# Patient Record
Sex: Female | Born: 1995 | Race: Black or African American | Hispanic: No | Marital: Single | Smoking: Current some day smoker
Health system: Southern US, Community
[De-identification: ages and names within clinical notes are randomized; demographics above are authoritative.]

---
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
## 2014-12-15 ENCOUNTER — Emergency Department (HOSPITAL_COMMUNITY)
Admission: EM | Admit: 2014-12-15 | Discharge: 2014-12-15 | Disposition: A | Discharge status: Home / Self Care | Payer: BLUE CROSS/BLUE SHIELD | Attending: Emergency Medicine | Admitting: Emergency Medicine

## 2014-12-15 ENCOUNTER — Encounter (HOSPITAL_COMMUNITY): Payer: Self-pay | Admitting: Emergency Medicine

## 2014-12-15 DIAGNOSIS — Y9289 Other specified places as the place of occurrence of the external cause: Secondary | ICD-10-CM | POA: Insufficient documentation

## 2014-12-15 DIAGNOSIS — Z79899 Other long term (current) drug therapy: Secondary | ICD-10-CM | POA: Diagnosis not present

## 2014-12-15 DIAGNOSIS — S0093XA Contusion of unspecified part of head, initial encounter: Secondary | ICD-10-CM | POA: Insufficient documentation

## 2014-12-15 DIAGNOSIS — S0990XA Unspecified injury of head, initial encounter: Secondary | ICD-10-CM

## 2014-12-15 DIAGNOSIS — Y998 Other external cause status: Secondary | ICD-10-CM | POA: Diagnosis not present

## 2014-12-15 DIAGNOSIS — W01198A Fall on same level from slipping, tripping and stumbling with subsequent striking against other object, initial encounter: Secondary | ICD-10-CM | POA: Diagnosis not present

## 2014-12-15 DIAGNOSIS — Y9389 Activity, other specified: Secondary | ICD-10-CM | POA: Insufficient documentation

## 2014-12-15 DIAGNOSIS — Z720 Tobacco use: Secondary | ICD-10-CM | POA: Insufficient documentation

## 2014-12-15 MED ORDER — IBUPROFEN 800 MG PO TABS
800.0000 mg | ORAL_TABLET | Freq: Once | ORAL | Status: AC
  Administered 2014-12-15: 800 mg via ORAL
  Filled 2014-12-15: qty 1

## 2014-12-15 MED ORDER — ONDANSETRON 4 MG PO TBDP
4.0000 mg | ORAL_TABLET | Freq: Once | ORAL | Status: AC
  Administered 2014-12-15: 4 mg via ORAL
  Filled 2014-12-15: qty 1

## 2014-12-15 MED ORDER — IBUPROFEN 600 MG PO TABS: 600.0000 mg | ORAL_TABLET | Freq: Four times a day (QID) | ORAL | Status: AC | PRN

## 2014-12-15 NOTE — ED Provider Notes (Signed)
CSN: [id number]     Arrival date & time 12/15/14  1511 History   First MD Initiated Contact with Patient 12/15/14 1551     Chief Complaint  Patient presents with  . Fall  . Head Injury     (Consider location/radiation/quality/duration/timing/severity/associated sxs/prior Treatment) Patient is a 19 y.o. female presenting with fall and head injury.  Fall This is a new problem. The current episode started yesterday. The problem occurs constantly. The problem has not changed since onset.Associated symptoms include headaches. Nothing aggravates the symptoms. Nothing relieves the symptoms. She has tried nothing for the symptoms. The treatment provided no relief.  Head Injury Location:  Generalized Time since incident:  1 day Mechanism of injury: fall   Pain details:    Quality:  Aching Chronicity:  New Relieved by:  Nothing Worsened by:  Nothing tried Ineffective treatments:  None tried Associated symptoms: blurred vision and headache   Associated symptoms: no difficulty breathing, no loss of consciousness and no neck pain     History reviewed. No pertinent past medical history. History reviewed. No pertinent past surgical history. No family history on file. Social History  Substance Use Topics  . Smoking status: Current Every Day Smoker  . Smokeless tobacco: None  . Alcohol Use: Yes   OB History    No data available     Review of Systems  Eyes: Positive for blurred vision.  Musculoskeletal: Negative for neck pain.  Neurological: Positive for headaches. Negative for loss of consciousness.  All other systems reviewed and are negative.     Allergies  Review of patient's allergies indicates no known allergies.  Home Medications   Prior to Admission medications   Medication Sig Start Date End Date Taking? Authorizing Provider  clotrimazole (LOTRIMIN) 1 % cream Apply to affected area 2 times daily 09/28/13   Emilia BeckKaitlyn Szekalski, PA-C  norelgestromin-ethinyl estradiol  Burr Medico(XULANE) 150-35 MCG/24HR transdermal patch Place 1 patch onto the skin once a week.    Historical Provider, MD   BP 133/69 mmHg  Pulse 84  Temp(Src) 98.2 F (36.8 C) (Oral)  Resp 14  Ht 5' 3.75" (1.619 m)  Wt 145 lb 4 oz (65.885 kg)  BMI 25.14 kg/m2  SpO2 100%  LMP 12/13/2014 Physical Exam  Constitutional: She is oriented to person, place, and time. She appears well-developed and well-nourished. No distress.  HENT:  Head: Normocephalic and atraumatic.  Eyes: Conjunctivae are normal.  Neck: Neck supple. No tracheal deviation present.  Cardiovascular: Normal rate and regular rhythm.   Pulmonary/Chest: Effort normal. No respiratory distress.  Abdominal: Soft. She exhibits no distension.  Neurological: She is alert and oriented to person, place, and time. She has normal strength. No cranial nerve deficit or sensory deficit. Coordination and gait normal. GCS eye subscore is 4. GCS verbal subscore is 5. GCS motor subscore is 6.  Skin: Skin is warm and dry.  Psychiatric: She has a normal mood and affect.    ED Course  Procedures (including critical care time) Labs Review Labs Reviewed - No data to display  Imaging Review No results found. I have personally reviewed and evaluated these images and lab results as part of my medical decision-making.   EKG Interpretation None      MDM   Final diagnoses:  Closed head injury, initial encounter  Head contusion, initial encounter    19 y.o. female presents with head injury after low energy fall yesterday striking top of head. No loss of consciousness, no emesis, no evidence of  basal skull fracture, no altered mental status following event. Has been 24 hours since insult. Considering pt's GCS of 15, lack of scalp hematoma, lack of evidence of skull base fracture, and lack of vomiting or altered mental status, I do not believe that further imaging is warranted in this pt. Possible mild concussion and return precautions discussed for  worsening. Pt has no neurologic deficit. NSAIDs for pain. Patient needs to establish primary care in the area and was provided contact information to do so.      Lyndal Pulley, MD 12/15/14 [phone]

## 2014-12-15 NOTE — ED Notes (Signed)
Pt slipped on hardwood floor while wearing socks around 1am this morning.  States she hit the top of her head on a tv stand.  Reports watery eyes, blurred vision, nausea, feeling tired, and headache.  No obvious injury noted to top of head.  Denies LOC.

## 2014-12-15 NOTE — Discharge Instructions (Signed)

## 2014-12-17 ENCOUNTER — Emergency Department (HOSPITAL_COMMUNITY)
Admission: EM | Admit: 2014-12-17 | Discharge: 2014-12-17 | Discharge status: Against Medical Advice | Payer: BLUE CROSS/BLUE SHIELD | Attending: Emergency Medicine | Admitting: Emergency Medicine

## 2014-12-17 ENCOUNTER — Encounter (HOSPITAL_COMMUNITY): Payer: Self-pay | Admitting: Emergency Medicine

## 2014-12-17 DIAGNOSIS — Z720 Tobacco use: Secondary | ICD-10-CM | POA: Diagnosis not present

## 2014-12-17 DIAGNOSIS — Z202 Contact with and (suspected) exposure to infections with a predominantly sexual mode of transmission: Secondary | ICD-10-CM | POA: Diagnosis present

## 2014-12-17 NOTE — ED Notes (Signed)
Patient wants to get STD tested.  Unsure of exposure.   Patient denies any symptoms.

## 2015-12-13 ENCOUNTER — Emergency Department (HOSPITAL_COMMUNITY)
Admission: EM | Admit: 2015-12-13 | Discharge: 2015-12-13 | Disposition: A | Discharge status: Home / Self Care | Payer: BLUE CROSS/BLUE SHIELD | Attending: Emergency Medicine | Admitting: Emergency Medicine

## 2015-12-13 ENCOUNTER — Encounter (HOSPITAL_COMMUNITY): Payer: Self-pay

## 2015-12-13 DIAGNOSIS — F17200 Nicotine dependence, unspecified, uncomplicated: Secondary | ICD-10-CM | POA: Diagnosis not present

## 2015-12-13 DIAGNOSIS — N760 Acute vaginitis: Secondary | ICD-10-CM | POA: Diagnosis not present

## 2015-12-13 DIAGNOSIS — B9689 Other specified bacterial agents as the cause of diseases classified elsewhere: Secondary | ICD-10-CM

## 2015-12-13 DIAGNOSIS — R102 Pelvic and perineal pain: Secondary | ICD-10-CM | POA: Diagnosis present

## 2015-12-13 LAB — URINALYSIS, ROUTINE W REFLEX MICROSCOPIC
BILIRUBIN URINE: NEGATIVE
GLUCOSE, UA: NEGATIVE mg/dL
Ketones, ur: NEGATIVE mg/dL
Leukocytes, UA: NEGATIVE
Nitrite: POSITIVE — AB
Protein, ur: 30 mg/dL — AB
SPECIFIC GRAVITY, URINE: 1.025 (ref 1.005–1.030)
pH: 6.5 (ref 5.0–8.0)

## 2015-12-13 LAB — LIPASE, BLOOD: Lipase: 26 U/L (ref 11–51)

## 2015-12-13 LAB — COMPREHENSIVE METABOLIC PANEL
ALK PHOS: 51 U/L (ref 38–126)
ALT: 15 U/L (ref 14–54)
AST: 23 U/L (ref 15–41)
Albumin: 4.6 g/dL (ref 3.5–5.0)
Anion gap: 8 (ref 5–15)
BUN: 9 mg/dL (ref 6–20)
CALCIUM: 10 mg/dL (ref 8.9–10.3)
CO2: 26 mmol/L (ref 22–32)
CREATININE: 0.82 mg/dL (ref 0.44–1.00)
Chloride: 105 mmol/L (ref 101–111)
GFR calc non Af Amer: >60 mL/min (ref >60)
GLUCOSE: 87 mg/dL (ref 65–99)
Potassium: 3.6 mmol/L (ref 3.5–5.1)
Sodium: 139 mmol/L (ref 135–145)
Total Bilirubin: 1.4 mg/dL — ABNORMAL HIGH (ref 0.3–1.2)
Total Protein: 7.4 g/dL (ref 6.5–8.1)

## 2015-12-13 LAB — URINE MICROSCOPIC-ADD ON

## 2015-12-13 LAB — CBC
HEMATOCRIT: 41.9 % (ref 36.0–46.0)
HEMOGLOBIN: 15.2 g/dL — AB (ref 12.0–15.0)
MCH: 30.2 pg (ref 26.0–34.0)
MCHC: 36.3 g/dL — AB (ref 30.0–36.0)
MCV: 83.3 fL (ref 78.0–100.0)
Platelets: 344 10*3/uL (ref 150–400)
RBC: 5.03 MIL/uL (ref 3.87–5.11)
RDW: 11.9 % (ref 11.5–15.5)
WBC: 7.4 10*3/uL (ref 4.0–10.5)

## 2015-12-13 LAB — WET PREP, GENITAL
SPERM: NONE SEEN
TRICH WET PREP: NONE SEEN
YEAST WET PREP: NONE SEEN

## 2015-12-13 LAB — I-STAT BETA HCG BLOOD, ED (MC, WL, AP ONLY): I-stat hCG, quantitative: <5 m[IU]/mL (ref <5)

## 2015-12-13 MED ORDER — METRONIDAZOLE 500 MG PO TABS: 500.0000 mg | ORAL_TABLET | Freq: Two times a day (BID) | ORAL | 0 refills | Status: DC

## 2015-12-13 MED ORDER — CEFTRIAXONE SODIUM 250 MG IJ SOLR
250.0000 mg | Freq: Once | INTRAMUSCULAR | Status: AC
  Administered 2015-12-13: 250 mg via INTRAMUSCULAR
  Filled 2015-12-13: qty 250

## 2015-12-13 MED ORDER — LIDOCAINE HCL (PF) 1 % IJ SOLN
INTRAMUSCULAR | Status: AC
  Administered 2015-12-13: 5 mL
  Filled 2015-12-13: qty 5

## 2015-12-13 MED ORDER — ONDANSETRON 4 MG PO TBDP
4.0000 mg | ORAL_TABLET | Freq: Once | ORAL | Status: AC
  Administered 2015-12-13: 4 mg via ORAL
  Filled 2015-12-13: qty 1

## 2015-12-13 MED ORDER — AZITHROMYCIN 250 MG PO TABS
1000.0000 mg | ORAL_TABLET | Freq: Once | ORAL | Status: AC
  Administered 2015-12-13: 1000 mg via ORAL
  Filled 2015-12-13: qty 4

## 2015-12-13 MED ORDER — LIDOCAINE HCL (PF) 1 % IJ SOLN
5.0000 mL | Freq: Once | INTRAMUSCULAR | Status: AC
  Administered 2015-12-13: 5 mL

## 2015-12-13 MED ORDER — ONDANSETRON HCL 4 MG PO TABS: 4.0000 mg | ORAL_TABLET | Freq: Four times a day (QID) | ORAL | 0 refills | Status: DC

## 2015-12-13 MED ORDER — METRONIDAZOLE 500 MG PO TABS
500.0000 mg | ORAL_TABLET | Freq: Once | ORAL | Status: AC
  Administered 2015-12-13: 500 mg via ORAL
  Filled 2015-12-13: qty 1

## 2015-12-13 NOTE — ED Triage Notes (Signed)
Patient complains of lower abdominal pain for over 1 week, nausea with same. Recently took antibiotics for UTI but states no improvement with the pain. Pain worse with ambulation and started her menstrual cycle yesterday, NAD

## 2015-12-13 NOTE — ED Provider Notes (Signed)
[facility]-EMERGENCY DEPT Provider Note   CSN: [id number] Arrival date & time: 12/13/15  1847  History   Chief Complaint No chief complaint on file.   HPI Kristina Boone is a 20 y.o. female.  HPI   Patient has no significant PMH but recent diagnosis of UTI comes to the ER with complaints of pelvic pain. It has been persisting for 2 weeks, she has completed the antibiotic and feels like it has made her worse. She is having vaginal discharge, a pelvic exam was not done at diagnosis of UTI. She has had white discharge and is currently on her menstrual cycle. She had one episode of vomiting yesterday. She has not had any diarrhea, back pain, fevers, flank pain, weakness, dysuria, cough or URI symptoms.   History reviewed. No pertinent past medical history.  There are no active problems to display for this patient.   History reviewed. No pertinent surgical history.  OB History    No data available       Home Medications    Prior to Admission medications   Medication Sig Start Date End Date Taking? Authorizing Provider  ibuprofen (ADVIL,MOTRIN) 200 MG tablet Take 200 mg by mouth every 6 (six) hours as needed for headache or cramping (pain).   Yes Historical Provider, MD  ibuprofen (ADVIL,MOTRIN) 600 MG tablet Take 1 tablet (600 mg total) by mouth every 6 (six) hours as needed for headache. Patient not taking: Reported on 12/13/2015 12/15/14   Lyndal Pulleyaniel Knott, MD  metroNIDAZOLE (FLAGYL) 500 MG tablet Take 1 tablet (500 mg total) by mouth 2 (two) times daily. 12/13/15   Janaye Corp Neva SeatGreene, PA-C  ondansetron (ZOFRAN) 4 MG tablet Take 1 tablet (4 mg total) by mouth every 6 (six) hours. 12/13/15   Marlon Peliffany Carollynn Pennywell, PA-C    Family History No family history on file.  Social History Social History  Substance Use Topics  . Smoking status: Current Some Day Smoker  . Smokeless tobacco: Never Used  . Alcohol use Yes     Allergies   Review of patient's allergies indicates no known  allergies.   Review of Systems Review of Systems  Review of Systems All other systems negative except as documented in the HPI. All pertinent positives and negatives as reviewed in the HPI.  Physical Exam Updated Vital Signs BP 126/86   Pulse 84   Temp 98.3 F (36.8 C) (Oral)   Resp 20   LMP 12/12/2015   SpO2 100%   Physical Exam  Constitutional: She appears well-developed and well-nourished.  HENT:  Head: Normocephalic and atraumatic.  Eyes: Conjunctivae are normal. Pupils are equal, round, and reactive to light.  Neck: Trachea normal, normal range of motion and full passive range of motion without pain. Neck supple.  Cardiovascular: Normal rate, regular rhythm and normal pulses.   Pulmonary/Chest: Effort normal and breath sounds normal. Chest wall is not dull to percussion. She exhibits no tenderness, no crepitus, no edema, no deformity and no retraction.  Abdominal: Soft. Normal appearance and bowel sounds are normal. There is tenderness in the suprapubic area and left lower quadrant. There is no rigidity, no rebound, no guarding, no CVA tenderness, no tenderness at McBurney's point and negative Murphy's sign.  Genitourinary: Uterus normal. Cervix exhibits no motion tenderness, no discharge and no friability. Right adnexum displays no mass and no tenderness. Left adnexum displays no mass and no tenderness. There is bleeding in the vagina. No erythema or tenderness in the vagina. No foreign body in the vagina. No signs  of injury around the vagina. No vaginal discharge found.  Musculoskeletal: Normal range of motion.  Neurological: She is alert. She has normal strength.  Skin: Skin is warm, dry and intact.  Psychiatric: She has a normal mood and affect. Her speech is normal and behavior is normal. Judgment and thought content normal. Cognition and memory are normal.     ED Treatments / Results  Labs (all labs ordered are listed, but only abnormal results are displayed) Labs  Reviewed  WET PREP, GENITAL - Abnormal; Notable for the following:       Result Value   Clue Cells Wet Prep HPF POC PRESENT (*)    WBC, Wet Prep HPF POC MANY (*)    All other components within normal limits  URINALYSIS, ROUTINE W REFLEX MICROSCOPIC (NOT AT [facility]) - Abnormal; Notable for the following:    APPearance CLOUDY (*)    Hgb urine dipstick LARGE (*)    Protein, ur 30 (*)    Nitrite POSITIVE (*)    All other components within normal limits  CBC - Abnormal; Notable for the following:    Hemoglobin 15.2 (*)    MCHC 36.3 (*)    All other components within normal limits  COMPREHENSIVE METABOLIC PANEL - Abnormal; Notable for the following:    Total Bilirubin 1.4 (*)    All other components within normal limits  URINE MICROSCOPIC-ADD ON - Abnormal; Notable for the following:    Squamous Epithelial / LPF 0-5 (*)    Bacteria, UA MANY (*)    All other components within normal limits  URINE CULTURE  LIPASE, BLOOD  I-STAT BETA HCG BLOOD, ED ([facility], [facility], [facility] ONLY)  GC/CHLAMYDIA PROBE AMP ([place]) NOT AT [facility]    EKG  EKG Interpretation None       Radiology No results found.  Procedures Procedures (including critical care time)  Medications Ordered in ED Medications  metroNIDAZOLE (FLAGYL) tablet 500 mg (500 mg Oral Given 12/13/15 2152)  azithromycin (ZITHROMAX) tablet 1,000 mg (1,000 mg Oral Given 12/13/15 2152)  cefTRIAXone (ROCEPHIN) injection 250 mg (250 mg Intramuscular Given 12/13/15 2153)  ondansetron (ZOFRAN-ODT) disintegrating tablet 4 mg (4 mg Oral Given 12/13/15 2149)  lidocaine (PF) (XYLOCAINE) 1 % injection 5 mL (5 mLs Infiltration Given 12/13/15 2153)     Initial Impression / Assessment and Plan / ED Course  I have reviewed the triage vital signs and the nursing notes.  Pertinent labs & imaging results that were available during my care of the patient were reviewed by me and considered in my medical decision making (see chart for details).  Clinical Course     Patient has BV on wet prep and MANY WBC, treated in the ED and rx for Flagyl. Urine culture sent out due to recent treatment and concern for contamination from vaginal infection. Pt does not have symptoms consistent with PID and is in stable condition at discharge. [facility] and discussed return precautions.  Final Clinical Impressions(s) / ED Diagnoses   Final diagnoses:  Bacterial vaginosis    New Prescriptions New Prescriptions   METRONIDAZOLE (FLAGYL) 500 MG TABLET    Take 1 tablet (500 mg total) by mouth 2 (two) times daily.   ONDANSETRON (ZOFRAN) 4 MG TABLET    Take 1 tablet (4 mg total) by mouth every 6 (six) hours.     Marlon Peliffany Tacarra Justo, PA-C 12/13/15 2215    Dione Boozeavid Glick, MD 12/14/15 [phone]

## 2015-12-13 NOTE — ED Notes (Signed)
Pt is in stable condition upon d/c and ambulates from ED. 

## 2015-12-15 LAB — GC/CHLAMYDIA PROBE AMP (~~LOC~~) NOT AT ARMC
Chlamydia: NEGATIVE
NEISSERIA GONORRHEA: NEGATIVE

## 2015-12-16 LAB — URINE CULTURE

## 2015-12-17 NOTE — Progress Notes (Signed)
ED Antimicrobial Stewardship Positive Culture Follow Up   Kristina Boone is an 20 y.o. female who presented [facility] with a chief complaint of No chief complaint on file.   Recent Results (from the past 720 hour(s))  Urine culture     Status: Abnormal   Collection Time: 12/13/15  7:02 PM  Result Value Ref Range Status   Specimen Description URINE, RANDOM  Final   Special Requests CX ADDED AT 0101 ON 110517  Final   Culture >=100,000 COLONIES/mL ESCHERICHIA COLI (A)  Final   Report Status 12/16/2015 FINAL  Final   Organism ID, Bacteria ESCHERICHIA COLI (A)  Final      Susceptibility   Escherichia coli - MIC*    AMPICILLIN 8 SENSITIVE Sensitive     CEFAZOLIN <=4 SENSITIVE Sensitive     CEFTRIAXONE <=1 SENSITIVE Sensitive     CIPROFLOXACIN <=0.25 SENSITIVE Sensitive     GENTAMICIN <=1 SENSITIVE Sensitive     IMIPENEM <=0.25 SENSITIVE Sensitive     NITROFURANTOIN <=16 SENSITIVE Sensitive     TRIMETH/SULFA <=20 SENSITIVE Sensitive     AMPICILLIN/SULBACTAM 4 SENSITIVE Sensitive     PIP/TAZO <=4 SENSITIVE Sensitive     Extended ESBL NEGATIVE Sensitive     * >=100,000 COLONIES/mL ESCHERICHIA COLI  Wet prep, genital     Status: Abnormal   Collection Time: 12/13/15  8:42 PM  Result Value Ref Range Status   Yeast Wet Prep HPF POC NONE SEEN NONE SEEN Final   Trich, Wet Prep NONE SEEN NONE SEEN Final   Clue Cells Wet Prep HPF POC PRESENT (A) NONE SEEN Final   WBC, Wet Prep HPF POC MANY (A) NONE SEEN Final   Sperm NONE SEEN  Final    Pt with E. Coli on urine culture. Given lack of urinary symptoms, no additional treatment warranted.  ED Provider: Burna FortsJeff Hedges, PA  Allena Katzaroline E Welles, Pharm.D. PGY1 Pharmacy Resident 11/8/20178:35 AM Pager [phone] Phone# [phone]

## 2016-04-22 ENCOUNTER — Ambulatory Visit (HOSPITAL_COMMUNITY)
Admission: EM | Admit: 2016-04-22 | Discharge: 2016-04-22 | Disposition: A | Discharge status: Home / Self Care | Payer: BLUE CROSS/BLUE SHIELD | Attending: Family Medicine | Admitting: Family Medicine

## 2016-04-22 ENCOUNTER — Encounter (HOSPITAL_COMMUNITY): Payer: Self-pay | Admitting: Emergency Medicine

## 2016-04-22 DIAGNOSIS — B9789 Other viral agents as the cause of diseases classified elsewhere: Secondary | ICD-10-CM

## 2016-04-22 DIAGNOSIS — J069 Acute upper respiratory infection, unspecified: Secondary | ICD-10-CM | POA: Diagnosis not present

## 2016-04-22 MED ORDER — ALBUTEROL SULFATE (2.5 MG/3ML) 0.083% IN NEBU
2.5000 mg | INHALATION_SOLUTION | Freq: Once | RESPIRATORY_TRACT | Status: AC
  Administered 2016-04-22: 2.5 mg via RESPIRATORY_TRACT

## 2016-04-22 MED ORDER — ALBUTEROL SULFATE (2.5 MG/3ML) 0.083% IN NEBU
INHALATION_SOLUTION | RESPIRATORY_TRACT | Status: AC
  Filled 2016-04-22: qty 3

## 2016-04-22 MED ORDER — PREDNISONE 10 MG PO TABS: 20.0000 mg | ORAL_TABLET | Freq: Every day | ORAL | 0 refills | Status: DC

## 2016-04-22 MED ORDER — IPRATROPIUM BROMIDE 0.06 % NA SOLN: 2.0000 | Freq: Four times a day (QID) | NASAL | 0 refills | Status: DC

## 2016-04-22 NOTE — ED Triage Notes (Signed)
Pt began feeling sick with a URI on Monday.  Today she reports "pain in my lungs".

## 2016-04-22 NOTE — ED Provider Notes (Signed)
CSN: [id number]     Arrival date & time 04/22/16  1234 History   First MD Initiated Contact with Patient 04/22/16 1324     Chief Complaint  Patient presents with  . Chest Pain   (Consider location/radiation/quality/duration/timing/severity/associated sxs/prior Treatment) Patient c/o uri sx's for over 4 days now.  She states she has difficulty taking deep breath and feels like her lungs hurt.  She c/o congestion.   The history is provided by the patient.  Chest Pain  Pain location:  L chest and R chest Pain quality: aching and tightness   Pain radiates to:  Does not radiate Pain severity:  Mild Onset quality:  Sudden Duration:  4 days Timing:  Constant Progression:  Worsening Chronicity:  New Context: breathing   Relieved by:  None tried Worsened by:  Nothing Ineffective treatments:  None tried Associated symptoms: cough and fatigue     History reviewed. No pertinent past medical history. History reviewed. No pertinent surgical history. History reviewed. No pertinent family history. Social History  Substance Use Topics  . Smoking status: Current Some Day Smoker  . Smokeless tobacco: Never Used  . Alcohol use Yes   OB History    No data available     Review of Systems  Constitutional: Positive for fatigue.  HENT: Negative.   Eyes: Negative.   Respiratory: Positive for cough.   Cardiovascular: Positive for chest pain.  Gastrointestinal: Negative.   Endocrine: Negative.   Genitourinary: Negative.   Musculoskeletal: Negative.   Skin: Negative.   Allergic/Immunologic: Negative.   Neurological: Negative.   Hematological: Negative.   Psychiatric/Behavioral: Negative.     Allergies  Patient has no known allergies.  Home Medications   Prior to Admission medications   Medication Sig Start Date End Date Taking? Authorizing Provider  ibuprofen (ADVIL,MOTRIN) 200 MG tablet Take 200 mg by mouth every 6 (six) hours as needed for headache or cramping (pain).     Historical Provider, MD  ibuprofen (ADVIL,MOTRIN) 600 MG tablet Take 1 tablet (600 mg total) by mouth every 6 (six) hours as needed for headache. Patient not taking: Reported on 12/13/2015 12/15/14   Lyndal Pulley, MD  ipratropium (ATROVENT) 0.06 % nasal spray Place 2 sprays into both nostrils 4 (four) times daily. 04/22/16   Deatra Canter, FNP  metroNIDAZOLE (FLAGYL) 500 MG tablet Take 1 tablet (500 mg total) by mouth 2 (two) times daily. 12/13/15   Tiffany Neva Seat, PA-C  ondansetron (ZOFRAN) 4 MG tablet Take 1 tablet (4 mg total) by mouth every 6 (six) hours. 12/13/15   Tiffany Neva Seat, PA-C  predniSONE (DELTASONE) 10 MG tablet Take 2 tablets (20 mg total) by mouth daily. 04/22/16   Deatra Canter, FNP   Meds Ordered and Administered this Visit   Medications  albuterol (PROVENTIL) (2.5 MG/3ML) 0.083% nebulizer solution 2.5 mg (2.5 mg Nebulization Given 04/22/16 1335)    BP 114/68 (BP Location: Left Arm)   Pulse 76   Temp 98.6 F (37 C) (Oral)   Resp 12   SpO2 98%  No data found.   Physical Exam  Constitutional: She appears well-developed and well-nourished.  HENT:  Head: Normocephalic and atraumatic.  Right Ear: External ear normal.  Left Ear: External ear normal.  Mouth/Throat: Oropharynx is clear and moist.  Eyes: Conjunctivae and EOM are normal. Pupils are equal, round, and reactive to light.  Neck: Normal range of motion. Neck supple.  Cardiovascular: Normal rate, regular rhythm and normal heart sounds.   Pulmonary/Chest: Effort normal and  breath sounds normal.  Abdominal: Soft. Bowel sounds are normal.  Nursing note and vitals reviewed.   Urgent Care Course     Procedures (including critical care time)  Labs Review Labs Reviewed - No data to display  Imaging Review No results found.   Visual Acuity Review  Right Eye Distance:   Left Eye Distance:   Bilateral Distance:    Right Eye Near:   Left Eye Near:    Bilateral Near:         MDM   1. Viral URI     Prednisone 20mg  po qd x 4days  Atrovent nasal spray  Nebulizer treatment - Good results  Push po fluids, rest, tylenol and motrin otc prn as directed for fever, arthralgias, and myalgias.  Follow up prn if sx's continue or persist.    Deatra CanterWilliam J Abigaelle Verley, FNP 04/22/16 1423

## 2016-05-23 ENCOUNTER — Emergency Department (HOSPITAL_COMMUNITY)
Admission: EM | Admit: 2016-05-23 | Discharge: 2016-05-23 | Disposition: A | Discharge status: Home / Self Care | Payer: BLUE CROSS/BLUE SHIELD | Attending: Emergency Medicine | Admitting: Emergency Medicine

## 2016-05-23 ENCOUNTER — Encounter (HOSPITAL_COMMUNITY): Payer: Self-pay | Admitting: Emergency Medicine

## 2016-05-23 ENCOUNTER — Emergency Department (HOSPITAL_COMMUNITY): Payer: BLUE CROSS/BLUE SHIELD

## 2016-05-23 DIAGNOSIS — Y92410 Unspecified street and highway as the place of occurrence of the external cause: Secondary | ICD-10-CM | POA: Insufficient documentation

## 2016-05-23 DIAGNOSIS — Y9389 Activity, other specified: Secondary | ICD-10-CM | POA: Diagnosis not present

## 2016-05-23 DIAGNOSIS — F17200 Nicotine dependence, unspecified, uncomplicated: Secondary | ICD-10-CM | POA: Diagnosis not present

## 2016-05-23 DIAGNOSIS — S39012A Strain of muscle, fascia and tendon of lower back, initial encounter: Secondary | ICD-10-CM | POA: Diagnosis not present

## 2016-05-23 DIAGNOSIS — Y999 Unspecified external cause status: Secondary | ICD-10-CM | POA: Insufficient documentation

## 2016-05-23 DIAGNOSIS — S3992XA Unspecified injury of lower back, initial encounter: Secondary | ICD-10-CM | POA: Diagnosis present

## 2016-05-23 MED ORDER — ACETAMINOPHEN 325 MG PO TABS
650.0000 mg | ORAL_TABLET | Freq: Once | ORAL | Status: AC
  Administered 2016-05-23: 650 mg via ORAL
  Filled 2016-05-23: qty 2

## 2016-05-23 NOTE — ED Notes (Signed)
Patient transported to X-ray 
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
## 2016-05-23 NOTE — Discharge Instructions (Signed)
It was our pleasure to provide your ER care today - we hope that you feel better.  You may take ibuprofen or acetaminophen as need for pain.  Follow up with primary care doctor in 1 week if symptoms fail to improve/resolve.  Return to ER if worse, new symptoms, severe pain, other concern.
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
## 2016-05-23 NOTE — ED Notes (Signed)
Cup of water provided to patient to provide fluids.
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
